# Patient Record
Sex: Male | Born: 1995 | Race: Black or African American | Hispanic: No | Marital: Single | Smoking: Never smoker
Health system: Southern US, Community
[De-identification: ages and names within clinical notes are randomized; demographics above are authoritative.]

## PROBLEM LIST (undated history)

## (undated) ENCOUNTER — Emergency Department (HOSPITAL_COMMUNITY): Admission: EM | Discharge status: Absent without leave | Payer: BC Managed Care – PPO

## (undated) HISTORY — PX: ADENOIDECTOMY: SUR15

---
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
## 2017-01-22 ENCOUNTER — Encounter (HOSPITAL_COMMUNITY): Payer: Self-pay | Admitting: Emergency Medicine

## 2017-01-22 ENCOUNTER — Emergency Department (HOSPITAL_COMMUNITY)
Admission: EM | Admit: 2017-01-22 | Discharge: 2017-01-22 | Disposition: A | Discharge status: Home / Self Care | Payer: BC Managed Care – PPO | Attending: Emergency Medicine | Admitting: Emergency Medicine

## 2017-01-22 DIAGNOSIS — R1084 Generalized abdominal pain: Secondary | ICD-10-CM | POA: Diagnosis not present

## 2017-01-22 DIAGNOSIS — R112 Nausea with vomiting, unspecified: Secondary | ICD-10-CM | POA: Diagnosis present

## 2017-01-22 DIAGNOSIS — K920 Hematemesis: Secondary | ICD-10-CM | POA: Diagnosis not present

## 2017-01-22 LAB — URINALYSIS, ROUTINE W REFLEX MICROSCOPIC
Bacteria, UA: NONE SEEN
Bilirubin Urine: NEGATIVE
Glucose, UA: NEGATIVE mg/dL
Hgb urine dipstick: NEGATIVE
Ketones, ur: NEGATIVE mg/dL
LEUKOCYTES UA: NEGATIVE
Nitrite: NEGATIVE
PH: 7 (ref 5.0–8.0)
Protein, ur: 30 mg/dL — AB
SPECIFIC GRAVITY, URINE: 1.027 (ref 1.005–1.030)

## 2017-01-22 LAB — COMPREHENSIVE METABOLIC PANEL
ALK PHOS: 68 U/L (ref 38–126)
ALT: 20 U/L (ref 17–63)
ANION GAP: 8 (ref 5–15)
AST: 24 U/L (ref 15–41)
Albumin: 4.1 g/dL (ref 3.5–5.0)
BUN: 15 mg/dL (ref 6–20)
CO2: 24 mmol/L (ref 22–32)
Calcium: 9.5 mg/dL (ref 8.9–10.3)
Chloride: 106 mmol/L (ref 101–111)
Creatinine, Ser: 1.37 mg/dL — ABNORMAL HIGH (ref 0.61–1.24)
GFR calc Af Amer: >60 mL/min (ref >60)
Glucose, Bld: 92 mg/dL (ref 65–99)
Potassium: 4.4 mmol/L (ref 3.5–5.1)
Sodium: 138 mmol/L (ref 135–145)
TOTAL PROTEIN: 6.9 g/dL (ref 6.5–8.1)
Total Bilirubin: 1.3 mg/dL — ABNORMAL HIGH (ref 0.3–1.2)

## 2017-01-22 LAB — CBC
HCT: 42.8 % (ref 39.0–52.0)
Hemoglobin: 14.4 g/dL (ref 13.0–17.0)
MCH: 31.7 pg (ref 26.0–34.0)
MCHC: 33.6 g/dL (ref 30.0–36.0)
MCV: 94.3 fL (ref 78.0–100.0)
Platelets: 222 10*3/uL (ref 150–400)
RBC: 4.54 MIL/uL (ref 4.22–5.81)
RDW: 13.3 % (ref 11.5–15.5)
WBC: 3.1 10*3/uL — ABNORMAL LOW (ref 4.0–10.5)

## 2017-01-22 LAB — POC OCCULT BLOOD, ED: Fecal Occult Bld: NEGATIVE

## 2017-01-22 LAB — LIPASE, BLOOD: Lipase: 23 U/L (ref 11–51)

## 2017-01-22 MED ORDER — PANTOPRAZOLE SODIUM 20 MG PO TBEC: 20.0000 mg | DELAYED_RELEASE_TABLET | Freq: Every day | ORAL | 0 refills | Status: DC

## 2017-01-22 MED ORDER — ONDANSETRON 4 MG PO TBDP
ORAL_TABLET | ORAL | Status: AC
  Filled 2017-01-22: qty 1

## 2017-01-22 MED ORDER — ONDANSETRON 8 MG PO TBDP: 8.0000 mg | ORAL_TABLET | Freq: Three times a day (TID) | ORAL | 0 refills | Status: AC | PRN

## 2017-01-22 MED ORDER — ONDANSETRON 4 MG PO TBDP
4.0000 mg | ORAL_TABLET | Freq: Once | ORAL | Status: AC | PRN
  Administered 2017-01-22: 4 mg via ORAL

## 2017-01-22 MED ORDER — FAMOTIDINE 20 MG PO TABS: 20.0000 mg | ORAL_TABLET | Freq: Two times a day (BID) | ORAL | 0 refills | Status: AC

## 2017-01-22 NOTE — ED Triage Notes (Signed)
Onset 2 day ago "throwing up blood" multiple episodes with general abdominal pain 7/10 achy. Denies diarrhea.

## 2017-01-22 NOTE — ED Provider Notes (Signed)
[facility]-EMERGENCY DEPT Provider Note   CSN: [id number] Arrival date & time: 01/22/17  0917     History   Chief Complaint Chief Complaint  Patient presents with  . Hematemesis  . Abdominal Pain    HPI Rick Molina is a 21 y.o. male.  HPI Rick Molina is a 21 y.o. male presents to emergency department complaining of nausea, vomiting, bloody emesis. Patient states that he started throwing up 2 days ago. He states initially threw up during practice. Patient is a [employer] at [employer]. Patient states initially was throwing up water, however started seeing bright red streaks of blood in his emesis. He has thrown up multiple times in the last 2 days each time with some bloody emesis. He is reporting some epigastric abdominal pain as well as lower abdominal pain. The pain comes and goes. Describes it as sharp. Currently no pain. Also describes associated dizziness. He states that they have been practicing a lot outside in the heat. He has been drinking plenty of water. He states he still able to eat and keep food and fluids down. He states that eating does not make his nausea or vomiting worse, also not making his abdominal pain worse. Patient denies taking any blood thinners. He denies having any bleeding disorders. He states he is having normal bowel movements. He denies alcohol use. He does not smoke. He has been using ibuprofen every day for minor aches and pains.   History reviewed. No pertinent past medical history.  There are no active problems to display for this patient.   Past Surgical History:  Procedure Laterality Date  . ADENOIDECTOMY         Home Medications    Prior to Admission medications   Not on File    Family History Family History  Problem Relation Age of Onset  . Cancer Mother   . Hypertension Mother     Social History Social History  Substance Use Topics  . Smoking status: Never Smoker  . Smokeless tobacco: Not on file  . Alcohol use No       Allergies   Shellfish allergy   Review of Systems Review of Systems  Constitutional: Negative for chills and fever.  Respiratory: Negative for cough, chest tightness and shortness of breath.   Cardiovascular: Negative for chest pain, palpitations and leg swelling.  Gastrointestinal: Positive for abdominal pain, nausea and vomiting. Negative for abdominal distention, blood in stool and diarrhea.  Genitourinary: Negative for dysuria, frequency, hematuria and urgency.  Musculoskeletal: Negative for arthralgias, myalgias, neck pain and neck stiffness.  Skin: Negative for rash.  Allergic/Immunologic: Negative for immunocompromised state.  Neurological: Positive for dizziness and light-headedness. Negative for weakness, numbness and headaches.  All other systems reviewed and are negative.    Physical Exam Updated Vital Signs BP 131/80   Pulse 60   Temp 98.3 F (36.8 C) (Oral)   Resp 18   Ht 6' (1.829 m)   Wt 135.2 kg (298 lb)   SpO2 100%   BMI 40.42 kg/m   Physical Exam  Constitutional: He is oriented to person, place, and time. He appears well-developed and well-nourished. No distress.  HENT:  Head: Normocephalic and atraumatic.  Eyes: Conjunctivae are normal.  Neck: Neck supple.  Cardiovascular: Normal rate, regular rhythm and normal heart sounds.   Pulmonary/Chest: Effort normal. No respiratory distress. He has no wheezes. He has no rales.  Abdominal: Soft. Bowel sounds are normal. He exhibits no distension. There is tenderness. There is no  rebound.  Epigastric and suprapubic tenderness  Musculoskeletal: He exhibits no edema.  Neurological: He is alert and oriented to person, place, and time.  Skin: Skin is warm and dry.  Nursing note and vitals reviewed.    ED Treatments / Results  Labs (all labs ordered are listed, but only abnormal results are displayed) Labs Reviewed  COMPREHENSIVE METABOLIC PANEL - Abnormal; Notable for the following:       Result Value    Creatinine, Ser 1.37 (*)    Total Bilirubin 1.3 (*)    All other components within normal limits  CBC - Abnormal; Notable for the following:    WBC 3.1 (*)    All other components within normal limits  URINALYSIS, ROUTINE W REFLEX MICROSCOPIC - Abnormal; Notable for the following:    Protein, ur 30 (*)    Squamous Epithelial / LPF 0-5 (*)    All other components within normal limits  LIPASE, BLOOD  POC OCCULT BLOOD, ED    EKG  EKG Interpretation None       Radiology No results found.  Procedures Procedures (including critical care time)  Medications Ordered in ED Medications  ondansetron (ZOFRAN-ODT) 4 MG disintegrating tablet (not administered)  ondansetron (ZOFRAN-ODT) disintegrating tablet 4 mg (4 mg Oral Given 01/22/17 [id number])     Initial Impression / Assessment and Plan / ED Course  I have reviewed the triage vital signs and the nursing notes.  Pertinent labs & imaging results that were available during my care of the patient were reviewed by me and considered in my medical decision making (see chart for details).     Patient in emergency department with nausea and vomiting, with bloody streaks in his emesis. He is nontoxic-appearing, abdomen is soft, mild tenderness in epigastric area present. Vital signs are normal. Hemoglobin is stable. No difficulty eating. Question gastritis versus ulcer disease versus Mallory-Weiss tear. I do not think he needs any further evaluation in the department at this time. His Hemoccult was negative. We'll start him on Pepcid and Protonix, follow-up with gastroenterology as needed. Return precautions discussed. Advised to stop all NSAIDs.   Vitals:   01/22/17 1200 01/22/17 1215 01/22/17 1230 01/22/17 1245  BP: 130/70 (!) 132/58 121/77 131/80  Pulse: (!) 58  (!) 116 60  Resp:      Temp:      TempSrc:      SpO2:      Weight:      Height:         Final Clinical Impressions(s) / ED Diagnoses   Final diagnoses:  Hematemesis  with nausea    New Prescriptions New Prescriptions   FAMOTIDINE (PEPCID) 20 MG TABLET    Take 1 tablet (20 mg total) by mouth 2 (two) times daily.   ONDANSETRON (ZOFRAN ODT) 8 MG DISINTEGRATING TABLET    Take 1 tablet (8 mg total) by mouth every 8 (eight) hours as needed for nausea or vomiting.   PANTOPRAZOLE (PROTONIX) 20 MG TABLET    Take 1 tablet (20 mg total) by mouth daily.     Jaynie CrumbleKirichenko, Marykate Heuberger, PA-C 01/22/17 1304    Rolland PorterJames, Mark, MD 01/23/17 [phone]

## 2017-01-22 NOTE — ED Notes (Signed)
ED Provider at bedside. 

## 2017-01-22 NOTE — Discharge Instructions (Signed)
Start taking both pepcid and protonix daily for stomach irritation. Zofran for nausea and vomiting as needed. Avoid any NSAID medications such as ibuprofen, aleve, advil. Take tylenol as needed for pain. Follow up with gastroenterologist if continue to have symptoms.

## 2017-01-22 NOTE — ED Notes (Signed)
Pt ambulated to lobby with family and friend. Pt had no complaints or concerns upon discharge.

## 2017-01-22 NOTE — ED Notes (Addendum)
ED Provider at bedside. Occult collection.

## 2017-01-27 ENCOUNTER — Telehealth: Payer: Self-pay | Admitting: Internal Medicine

## 2017-01-27 NOTE — Telephone Encounter (Signed)
I spoke with Dr. Leona SingletonLake and advised him we can see the patient tomorrow and that I will attempt to reach the patient to arrange the appt.  I left a message for the patient that I have scheduled an appt for tomorrow at 2:15 with Hyacinth MeekerJennifer, Lemmon, PA And to please call me and confirm he got the message.

## 2017-01-27 NOTE — Telephone Encounter (Signed)
Sent to attention DOD Dr.Perry please advise as to scheduling.

## 2017-01-28 ENCOUNTER — Encounter: Payer: Self-pay | Admitting: Physician Assistant

## 2017-01-28 ENCOUNTER — Ambulatory Visit (INDEPENDENT_AMBULATORY_CARE_PROVIDER_SITE_OTHER): Payer: BC Managed Care – PPO | Admitting: Physician Assistant

## 2017-01-28 ENCOUNTER — Other Ambulatory Visit: Payer: Self-pay

## 2017-01-28 VITALS — BP 124/80 | HR 84 | Ht 72.0 in | Wt 296.0 lb

## 2017-01-28 DIAGNOSIS — R1013 Epigastric pain: Secondary | ICD-10-CM

## 2017-01-28 DIAGNOSIS — K920 Hematemesis: Secondary | ICD-10-CM | POA: Diagnosis not present

## 2017-01-28 MED ORDER — PANTOPRAZOLE SODIUM 40 MG PO TBEC: 40.0000 mg | DELAYED_RELEASE_TABLET | Freq: Every day | ORAL | 3 refills | Status: AC

## 2017-01-28 NOTE — Patient Instructions (Signed)
You have been scheduled for an endoscopy. Please follow written instructions given to you at your visit today. If you use inhalers (even only as needed), please bring them with you on the day of your procedure. Your physician has requested that you go to www.startemmi.com and enter the access code given to you at your visit today. This web site gives a general overview about your procedure. However, you should still follow specific instructions given to you by our office regarding your preparation for the procedure.  We have sent the following medications to your pharmacy for you to pick up at your convenience: Pantoprazole  If you are age 20 or older, your body mass index should be between 23-30. Your Body mass index is 40.14 kg/m. If this is out of the aforementioned range listed, please consider follow up with your Primary Care Provider.  If you are age 20 or younger, your body mass index should be between 19-25. Your Body mass index is 40.14 kg/m. If this is out of the aformentioned range listed, please consider follow up with your Primary Care Provider.

## 2017-01-28 NOTE — Progress Notes (Signed)
Chief Complaint: Hematemesis, Epigastric pain  HPI:  Rick Molina is a 21 year old African-American male, who was referred to me by Georgann Housekeeper, MD for a complaint of hematemesis and epigastric pain .     Patient was recently seen in the ER on 01/22/17 with hematemesis and epigastric pain. It was noted that he is a [employer] and initially started throwing up water 2 days prior during practice, he started seeing bright red streaks of blood in his emesis. He threw up multiple times over those 2 days and had some epigastric pain. Labs at that time showed a creatinine of 1.37 and were otherwise normal including a Lipase. Patient was started on Pepcid 20 mg twice a day, Zofran 8 mg every 8 hours and Pantoprazole 20 mg once daily.   Today, the patient presents to clinic accompanied by his football trainer and tells me that he has continued with episodes of nausea and vomiting over the past 2 weeks, the last on Saturday. He has continued to see some bright red blood streaking his vomit on occasion. Associated symptoms include an epigastric "discomfort", he rates this as a 1-2/10. When he vomits it is mostly "clear/yellow" substance. He has been using Zofran as directed but has not taken any since Sunday. He has been using Pepcid 20 mg twice a day and Pantoprazole 20 mg once a day. Overall the patient feels much better. He has been out of practice for 1 week. 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 They do try to stay well hydrated during practice.   Patient denies a change in diet, alcohol use, fever, chills, blood in the stool, melena, weight loss, anorexia, heartburn, reflux, dysphagia or symptoms that awaken him at night.  History reviewed. No pertinent past medical history.  Past Surgical History:  Procedure Laterality Date  . ADENOIDECTOMY      Current Outpatient Prescriptions  Medication Sig Dispense Refill  .  famotidine (PEPCID) 20 MG tablet Take 1 tablet (20 mg total) by mouth 2 (two) times daily. 30 tablet 0  . ondansetron (ZOFRAN ODT) 8 MG disintegrating tablet Take 1 tablet (8 mg total) by mouth every 8 (eight) hours as needed for nausea or vomiting. 12 tablet 0  . pantoprazole (PROTONIX) 20 MG tablet Take 1 tablet (20 mg total) by mouth daily. 30 tablet 0   No current facility-administered medications for this visit.     Allergies as of 01/28/2017 - Review Complete 01/28/2017  Allergen Reaction Noted  . Shellfish allergy  01/22/2017    Family History  Problem Relation Age of Onset  . Cancer Mother   . Hypertension Mother     Social History   Social History  . Marital status: Single    Spouse name: N/A  . Number of children: N/A  . Years of education: N/A   Occupational History  . Not on file.   Social History Main Topics  . Smoking status: Never Smoker  . Smokeless tobacco: Never Used  . Alcohol use No  . Drug use: No  . Sexual activity: Not on file   Other Topics Concern  . Not on file   Social History Narrative  . No narrative on file    Review of Systems:    Constitutional: No weight loss, fever or chills Skin: No rash  Cardiovascular: No chest pain, chest pressure or palpitations   Respiratory: No SOB  Gastrointestinal: See  HPI and otherwise negative Genitourinary: No dysuria  Neurological: No headache Musculoskeletal: No new muscle or joint pain Hematologic: No bleeding or bruising Psychiatric: No history of depression or anxiety   Physical Exam:  Vital signs: BP 124/80 (BP Location: Right Arm, Patient Position: Sitting, Cuff Size: Large)   Pulse 84   Ht 6' (1.829 m)   Wt 296 lb (134.3 kg)   BMI 40.14 kg/m   Constitutional:   Pleasant African American male appears to be in NAD, Well developed, Well nourished, alert and cooperative Head:  Normocephalic and atraumatic. Eyes:   PEERL, EOMI. No icterus. Conjunctiva pink. Ears:  Normal auditory  acuity. Neck:  Supple Throat: Oral cavity and pharynx without inflammation, swelling or lesion.  Respiratory: Respirations even and unlabored. Lungs clear to auscultation bilaterally.   No wheezes, crackles, or rhonchi.  Cardiovascular: Normal S1, S2. No MRG. Regular rate and rhythm. No peripheral edema, cyanosis or pallor.  Gastrointestinal:  Soft, nondistended, mild epigastric ttp No rebound or guarding. Normal bowel sounds. No appreciable masses or hepatomegaly. Rectal:  Not performed.  Msk:  Symmetrical without gross deformities. Without edema, no deformity or joint abnormality.  Neurologic:  Alert and  oriented x4;  grossly normal neurologically.  Skin:   Dry and intact without significant lesions or rashes. Psychiatric: Demonstrates good judgement and reason without abnormal affect or behaviors.  RELEVANT LABS AND IMAGING: CBC    Component Value Date/Time   WBC 3.1 (L) 01/22/2017 0947   RBC 4.54 01/22/2017 0947   HGB 14.4 01/22/2017 0947   HCT 42.8 01/22/2017 0947   PLT 222 01/22/2017 0947   MCV 94.3 01/22/2017 0947   MCV 93.4 12/05/2014 1043   MCH 31.7 01/22/2017 0947   MCHC 33.6 01/22/2017 0947   RDW 13.3 01/22/2017 0947    CMP     Component Value Date/Time   NA 138 01/22/2017 0947   K 4.4 01/22/2017 0947   CL 106 01/22/2017 0947   CO2 24 01/22/2017 0947   GLUCOSE 92 01/22/2017 0947   BUN 15 01/22/2017 0947   CREATININE 1.37 (H) 01/22/2017 0947   CREATININE 1.26 12/05/2014 1035   CALCIUM 9.5 01/22/2017 0947   PROT 6.9 01/22/2017 0947   ALBUMIN 4.1 01/22/2017 0947   AST 24 01/22/2017 0947   ALT 20 01/22/2017 0947   ALKPHOS 68 01/22/2017 0947   BILITOT 1.3 (H) 01/22/2017 0947   GFRNONAA >60 01/22/2017 0947   GFRAA >60 01/22/2017 0947    Assessment: 1. Hematemesis: Streaking his emesis over the past 2 weeks, last episode on Saturday, 01/25/17; most likely Mallory-Weiss tear 2. Epigastric pain: Over the past 2 weeks with nausea and hematemesis as above, better  on H2 blocker and PPI as well as Zofran; consider H. pylori versus gastritis versus other  Plan: 1. Symptoms are better with when necessary Zofran, Pepcid 20 twice a day and Pantoprazole 20 mg daily, no further episodes of hematemesis since Sunday. Patient is going to go back to hard training for football and wants to be cleared. With this in mind, recommend patient have EGD for full evaluation before he is cleared medically. 2. Scheduled EGD with Dr. Myrtie Neither in the [facility] tomorrow as he had some openings. Did discuss risks, benefits, limitations and alternatives and the patient agrees to proceed. 3. Patient was also discussed with Dr. Leone Payor supervising this morning. 4. Patient to continue on Pantoprazole 40 mg once daily. He can discontinue his Pepcid once this runs out in 2-3 days. 5. He may continue  Zofran as needed 6. Follow up to be recommended by Dr. Myrtie Neitheranis after time of procedure tomorrow.  Hyacinth MeekerJennifer Lemmon, PA-C [facility] 01/28/2017, 2:35 PM  Cc: Georgann Housekeeperooper, Alan, MD

## 2017-01-28 NOTE — Progress Notes (Signed)
Thank you for sending this case to me and for discussing it in clinic today. I have reviewed the entire note, and the outlined plan seems appropriate.   Ardell Makarewicz Danis, MD  

## 2017-01-28 NOTE — Telephone Encounter (Signed)
Pt states he will be at the visit today.

## 2017-01-29 ENCOUNTER — Ambulatory Visit (AMBULATORY_SURGERY_CENTER): Payer: BC Managed Care – PPO | Admitting: Gastroenterology

## 2017-01-29 ENCOUNTER — Encounter: Payer: Self-pay | Admitting: Gastroenterology

## 2017-01-29 VITALS — BP 114/64 | HR 64 | Temp 98.9°F | Resp 17 | Ht 72.0 in | Wt 296.0 lb

## 2017-01-29 DIAGNOSIS — K920 Hematemesis: Secondary | ICD-10-CM

## 2017-01-29 DIAGNOSIS — K3189 Other diseases of stomach and duodenum: Secondary | ICD-10-CM | POA: Diagnosis not present

## 2017-01-29 DIAGNOSIS — R112 Nausea with vomiting, unspecified: Secondary | ICD-10-CM | POA: Diagnosis not present

## 2017-01-29 DIAGNOSIS — R1013 Epigastric pain: Secondary | ICD-10-CM

## 2017-01-29 MED ORDER — SODIUM CHLORIDE 0.9 % IV SOLN: 500.0000 mL | INTRAVENOUS | Status: AC

## 2017-01-29 NOTE — Op Note (Signed)
[facility] Patient Name: Rick Molina Procedure Date: 01/29/2017 9:45 AM MRN: [id number] Endoscopist: Sherilyn Cooter L. Myrtie Neither , MD Age: 21 Referring MD:  Date of Birth: 1996/05/03 Gender: Male Account #: [id number] Procedure:                Upper GI endoscopy Indications:              Epigastric abdominal pain, Hematemesis, Nausea with                            vomiting Medicines:                Monitored Anesthesia Care Procedure:                Pre-Anesthesia Assessment:                           - Prior to the procedure, a History and Physical                            was performed, and patient medications and                            allergies were reviewed. The patient's tolerance of                            previous anesthesia was also reviewed. The risks                            and benefits of the procedure and the sedation                            options and risks were discussed with the patient.                            All questions were answered, and informed consent                            was obtained. Prior Anticoagulants: The patient has                            taken no previous anticoagulant or antiplatelet                            agents. ASA Grade Assessment: I - A normal, healthy                            patient. After reviewing the risks and benefits,                            the patient was deemed in satisfactory condition to                            undergo the procedure.  After obtaining informed consent, the endoscope was                            passed under direct vision. Throughout the                            procedure, the patient's blood pressure, pulse, and                            oxygen saturations were monitored continuously. The                            Model GIF-[id number](SN#[id number]) scope was introduced                            through the mouth, and advanced to the second part                        of duodenum. The upper GI endoscopy was                            accomplished without difficulty. The patient                            tolerated the procedure well. Scope In: Scope Out: Findings:                 The esophagus was normal.                           Striped mildly erythematous mucosa without bleeding                            was found in the prepyloric region of the stomach.                            Random gastric biopsies were taken ([facility]                            protocol).                           The cardia and gastric fundus were normal on                            retroflexion.                           The examined duodenum was normal. Complications:            No immediate complications. Estimated Blood Loss:     Estimated blood loss was minimal. Impression:               - Normal esophagus.                           - Erythematous mucosa in the prepyloric region of  the stomach. More likely to be the result of rather                            than the cause of recent vomiting.                           - Normal examined duodenum.                           - No specimens collected. Recommendation:           - Patient has a contact number available for                            emergencies. The signs and symptoms of potential                            delayed complications were discussed with the                            patient. Return to normal activities tomorrow.                            Written discharge instructions were provided to the                            patient.                           - Resume previous diet.                           - Discontinue PPIs today.                           - Discontinue H2 blocker today.                           - Await pathology results.                           - Patient can return to athletic training tomorrow.                           If  symptoms recur during training, a reduction in                            training intensity may be required temporarily.                           Ensure adequate hydration before, during and after                            training.                           - Return to my office as  needed. Hevin Jeffcoat L. Myrtie Neither, MD 01/29/2017 10:05:59 AM This report has been signed electronically.

## 2017-01-29 NOTE — Progress Notes (Signed)
Called to room to assist during endoscopic procedure.  Patient ID and intended procedure confirmed with present staff. Received instructions for my participation in the procedure from the performing physician.  

## 2017-01-29 NOTE — Progress Notes (Signed)
A/ox3 pleased with MAC, report to [facility]

## 2017-01-29 NOTE — Patient Instructions (Signed)
Discharge instructions given. Biopsies taken. Resume previous medications. YOU HAD AN ENDOSCOPIC PROCEDURE TODAY AT THE [facility]:   Refer to the procedure report that was given to you for any specific questions about what was found during the examination.  If the procedure report does not answer your questions, please call your gastroenterologist to clarify.  If you requested that your care partner not be given the details of your procedure findings, then the procedure report has been included in a sealed envelope for you to review at your convenience later.  YOU SHOULD EXPECT: Some feelings of bloating in the abdomen. Passage of more gas than usual.  Walking can help get rid of the air that was put into your GI tract during the procedure and reduce the bloating. If you had a lower endoscopy (such as a colonoscopy or flexible sigmoidoscopy) you may notice spotting of blood in your stool or on the toilet paper. If you underwent a bowel prep for your procedure, you may not have a normal bowel movement for a few days.  Please Note:  You might notice some irritation and congestion in your nose or some drainage.  This is from the oxygen used during your procedure.  There is no need for concern and it should clear up in a day or so.  SYMPTOMS TO REPORT IMMEDIATELY:   Following lower endoscopy (colonoscopy or flexible sigmoidoscopy):  Excessive amounts of blood in the stool  Significant tenderness or worsening of abdominal pains  Swelling of the abdomen that is new, acute  Fever of 100F or higher   For urgent or emergent issues, a gastroenterologist can be reached at any hour by calling [phone].   DIET:  We do recommend a small meal at first, but then you may proceed to your regular diet.  Drink plenty of fluids but you should avoid alcoholic beverages for 24 hours.  ACTIVITY:  You should plan to take it easy for the rest of today and you should NOT DRIVE or use heavy  machinery until tomorrow (because of the sedation medicines used during the test).    FOLLOW UP: Our staff will call the number listed on your records the next business day following your procedure to check on you and address any questions or concerns that you may have regarding the information given to you following your procedure. If we do not reach you, we will leave a message.  However, if you are feeling well and you are not experiencing any problems, there is no need to return our call.  We will assume that you have returned to your regular daily activities without incident.  If any biopsies were taken you will be contacted by phone or by letter within the next 1-3 weeks.  Please call us at [phone] if you have not heard about the biopsies in 3 weeks.    SIGNATURES/CONFIDENTIALITY: You and/or your care partner have signed paperwork which will be entered into your electronic medical record.  These signatures attest to the fact that that the information above on your After Visit Summary has been reviewed and is understood.  Full responsibility of the confidentiality of this discharge information lies with you and/or your care-partner. 

## 2017-01-30 ENCOUNTER — Telehealth: Payer: Self-pay

## 2017-01-30 NOTE — Telephone Encounter (Signed)
  Follow up Call-  Call back number 01/29/2017  Post procedure Call Back phone  # [phone]  Permission to leave phone message Yes  Some recent data might be hidden     Patient questions:  Do you have a fever, pain , or abdominal swelling? No. Pain Score  0 *  Have you tolerated food without any problems? Yes.    Have you been able to return to your normal activities? Yes.    Do you have any questions about your discharge instructions: Diet   No. Medications  No. Follow up visit  No.  Do you have questions or concerns about your Care? No.  Actions: * If pain score is 4 or above: No action needed, pain <4.

## 2019-01-30 ENCOUNTER — Other Ambulatory Visit: Payer: Self-pay

## 2019-01-30 DIAGNOSIS — Z20822 Contact with and (suspected) exposure to covid-19: Secondary | ICD-10-CM

## 2019-01-31 LAB — NOVEL CORONAVIRUS, NAA: SARS-CoV-2, NAA: NOT DETECTED

## 2019-10-02 ENCOUNTER — Other Ambulatory Visit: Payer: Self-pay

## 2019-10-02 ENCOUNTER — Emergency Department (HOSPITAL_COMMUNITY): Payer: BC Managed Care – PPO

## 2019-10-02 ENCOUNTER — Encounter (HOSPITAL_COMMUNITY): Payer: Self-pay | Admitting: General Surgery

## 2019-10-02 ENCOUNTER — Inpatient Hospital Stay (HOSPITAL_COMMUNITY)
Admission: EM | Admit: 2019-10-02 | Discharge: 2019-10-04 | DRG: 089 | Disposition: A | Discharge status: Home / Self Care | Payer: BC Managed Care – PPO | Attending: Physician Assistant | Admitting: Physician Assistant

## 2019-10-02 DIAGNOSIS — S01512A Laceration without foreign body of oral cavity, initial encounter: Secondary | ICD-10-CM | POA: Diagnosis present

## 2019-10-02 DIAGNOSIS — Y92410 Unspecified street and highway as the place of occurrence of the external cause: Secondary | ICD-10-CM

## 2019-10-02 DIAGNOSIS — S27329A Contusion of lung, unspecified, initial encounter: Secondary | ICD-10-CM

## 2019-10-02 DIAGNOSIS — E876 Hypokalemia: Secondary | ICD-10-CM | POA: Diagnosis present

## 2019-10-02 DIAGNOSIS — Z6841 Body Mass Index (BMI) 40.0 and over, adult: Secondary | ICD-10-CM

## 2019-10-02 DIAGNOSIS — Z87820 Personal history of traumatic brain injury: Secondary | ICD-10-CM | POA: Diagnosis not present

## 2019-10-02 DIAGNOSIS — S060X9A Concussion with loss of consciousness of unspecified duration, initial encounter: Principal | ICD-10-CM | POA: Diagnosis present

## 2019-10-02 DIAGNOSIS — Z20822 Contact with and (suspected) exposure to covid-19: Secondary | ICD-10-CM | POA: Diagnosis present

## 2019-10-02 DIAGNOSIS — S81812A Laceration without foreign body, left lower leg, initial encounter: Secondary | ICD-10-CM | POA: Diagnosis present

## 2019-10-02 DIAGNOSIS — S80212A Abrasion, left knee, initial encounter: Secondary | ICD-10-CM | POA: Diagnosis present

## 2019-10-02 DIAGNOSIS — S41132A Puncture wound without foreign body of left upper arm, initial encounter: Secondary | ICD-10-CM | POA: Diagnosis present

## 2019-10-02 DIAGNOSIS — R7401 Elevation of levels of liver transaminase levels: Secondary | ICD-10-CM | POA: Diagnosis present

## 2019-10-02 DIAGNOSIS — S060X0A Concussion without loss of consciousness, initial encounter: Secondary | ICD-10-CM

## 2019-10-02 DIAGNOSIS — S0121XA Laceration without foreign body of nose, initial encounter: Secondary | ICD-10-CM | POA: Diagnosis present

## 2019-10-02 DIAGNOSIS — N179 Acute kidney failure, unspecified: Secondary | ICD-10-CM | POA: Diagnosis present

## 2019-10-02 DIAGNOSIS — E668 Other obesity: Secondary | ICD-10-CM | POA: Diagnosis present

## 2019-10-02 DIAGNOSIS — M542 Cervicalgia: Secondary | ICD-10-CM

## 2019-10-02 DIAGNOSIS — R52 Pain, unspecified: Secondary | ICD-10-CM | POA: Diagnosis present

## 2019-10-02 DIAGNOSIS — S0990XA Unspecified injury of head, initial encounter: Secondary | ICD-10-CM

## 2019-10-02 LAB — URINALYSIS, ROUTINE W REFLEX MICROSCOPIC
Bilirubin Urine: NEGATIVE
Glucose, UA: NEGATIVE mg/dL
Ketones, ur: NEGATIVE mg/dL
Leukocytes,Ua: NEGATIVE
Nitrite: NEGATIVE
Protein, ur: 100 mg/dL — AB
RBC / HPF: >50 RBC/hpf — ABNORMAL HIGH (ref 0–5)
Specific Gravity, Urine: >1.046 — ABNORMAL HIGH (ref 1.005–1.030)
pH: 5 (ref 5.0–8.0)

## 2019-10-02 LAB — CBC
HCT: 41.5 % (ref 39.0–52.0)
Hemoglobin: 14 g/dL (ref 13.0–17.0)
MCH: 32 pg (ref 26.0–34.0)
MCHC: 33.7 g/dL (ref 30.0–36.0)
MCV: 95 fL (ref 80.0–100.0)
Platelets: 297 10*3/uL (ref 150–400)
RBC: 4.37 MIL/uL (ref 4.22–5.81)
RDW: 12.5 % (ref 11.5–15.5)
WBC: 9.2 10*3/uL (ref 4.0–10.5)
nRBC: 0 % (ref 0.0–0.2)

## 2019-10-02 LAB — LACTIC ACID, PLASMA: Lactic Acid, Venous: 2.1 mmol/L (ref 0.5–1.9)

## 2019-10-02 LAB — COMPREHENSIVE METABOLIC PANEL
ALT: 79 U/L — ABNORMAL HIGH (ref 0–44)
AST: 111 U/L — ABNORMAL HIGH (ref 15–41)
Albumin: 3.8 g/dL (ref 3.5–5.0)
Alkaline Phosphatase: 46 U/L (ref 38–126)
Anion gap: 12 (ref 5–15)
BUN: 15 mg/dL (ref 6–20)
CO2: 22 mmol/L (ref 22–32)
Calcium: 8.8 mg/dL — ABNORMAL LOW (ref 8.9–10.3)
Chloride: 104 mmol/L (ref 98–111)
Creatinine, Ser: 1.72 mg/dL — ABNORMAL HIGH (ref 0.61–1.24)
GFR calc Af Amer: >60 mL/min (ref >60)
GFR calc non Af Amer: 55 mL/min — ABNORMAL LOW (ref >60)
Glucose, Bld: 128 mg/dL — ABNORMAL HIGH (ref 70–99)
Potassium: 3.2 mmol/L — ABNORMAL LOW (ref 3.5–5.1)
Sodium: 138 mmol/L (ref 135–145)
Total Bilirubin: 0.6 mg/dL (ref 0.3–1.2)
Total Protein: 6.3 g/dL — ABNORMAL LOW (ref 6.5–8.1)

## 2019-10-02 LAB — RESPIRATORY PANEL BY RT PCR (FLU A&B, COVID)
Influenza A by PCR: NEGATIVE
Influenza B by PCR: NEGATIVE
SARS Coronavirus 2 by RT PCR: NEGATIVE

## 2019-10-02 LAB — SAMPLE TO BLOOD BANK

## 2019-10-02 LAB — I-STAT CHEM 8, ED
BUN: 16 mg/dL (ref 6–20)
Calcium, Ion: 1.04 mmol/L — ABNORMAL LOW (ref 1.15–1.40)
Chloride: 103 mmol/L (ref 98–111)
Creatinine, Ser: 1.6 mg/dL — ABNORMAL HIGH (ref 0.61–1.24)
Glucose, Bld: 120 mg/dL — ABNORMAL HIGH (ref 70–99)
HCT: 42 % (ref 39.0–52.0)
Hemoglobin: 14.3 g/dL (ref 13.0–17.0)
Potassium: 3.2 mmol/L — ABNORMAL LOW (ref 3.5–5.1)
Sodium: 140 mmol/L (ref 135–145)
TCO2: 26 mmol/L (ref 22–32)

## 2019-10-02 LAB — ETHANOL: Alcohol, Ethyl (B): 10 mg/dL — ABNORMAL HIGH (ref <10)

## 2019-10-02 LAB — PROTIME-INR
INR: 1.1 (ref 0.8–1.2)
Prothrombin Time: 14.2 seconds (ref 11.4–15.2)

## 2019-10-02 MED ORDER — TETANUS-DIPHTH-ACELL PERTUSSIS 5-2.5-18.5 LF-MCG/0.5 IM SUSP
0.5000 mL | Freq: Once | INTRAMUSCULAR | Status: AC
  Administered 2019-10-02: 0.5 mL via INTRAMUSCULAR
  Filled 2019-10-02: qty 0.5

## 2019-10-02 MED ORDER — KCL IN DEXTROSE-NACL 20-5-0.45 MEQ/L-%-% IV SOLN
INTRAVENOUS | Status: DC
  Filled 2019-10-02 (×4): qty 1000

## 2019-10-02 MED ORDER — ENOXAPARIN SODIUM 40 MG/0.4ML ~~LOC~~ SOLN
40.0000 mg | SUBCUTANEOUS | Status: DC
  Administered 2019-10-02: 40 mg via SUBCUTANEOUS
  Filled 2019-10-02: qty 0.4

## 2019-10-02 MED ORDER — ACETAMINOPHEN 325 MG PO TABS
650.0000 mg | ORAL_TABLET | ORAL | Status: DC | PRN
  Filled 2019-10-02: qty 2

## 2019-10-02 MED ORDER — MORPHINE SULFATE (PF) 4 MG/ML IV SOLN
4.0000 mg | INTRAVENOUS | Status: DC | PRN
  Administered 2019-10-02: 4 mg via INTRAVENOUS
  Filled 2019-10-02: qty 1

## 2019-10-02 MED ORDER — MORPHINE SULFATE (PF) 2 MG/ML IV SOLN: 2.0000 mg | INTRAVENOUS | Status: DC | PRN

## 2019-10-02 MED ORDER — LIDOCAINE HCL (PF) 1 % IJ SOLN
INTRAMUSCULAR | Status: AC
  Filled 2019-10-02: qty 5

## 2019-10-02 MED ORDER — OXYCODONE HCL 5 MG PO TABS
10.0000 mg | ORAL_TABLET | ORAL | Status: DC | PRN
  Administered 2019-10-03 (×2): 10 mg via ORAL
  Filled 2019-10-02 (×2): qty 2

## 2019-10-02 MED ORDER — ONDANSETRON 4 MG PO TBDP
4.0000 mg | ORAL_TABLET | Freq: Four times a day (QID) | ORAL | Status: DC | PRN
  Administered 2019-10-03: 4 mg via ORAL
  Filled 2019-10-02: qty 1

## 2019-10-02 MED ORDER — OXYCODONE HCL 5 MG PO TABS: 5.0000 mg | ORAL_TABLET | ORAL | Status: DC | PRN

## 2019-10-02 MED ORDER — ONDANSETRON HCL 4 MG/2ML IJ SOLN: 4.0000 mg | Freq: Four times a day (QID) | INTRAMUSCULAR | Status: DC | PRN

## 2019-10-02 MED ORDER — IOHEXOL 300 MG/ML  SOLN
100.0000 mL | Freq: Once | INTRAMUSCULAR | Status: AC | PRN
  Administered 2019-10-02: 100 mL via INTRAVENOUS

## 2019-10-02 MED ORDER — MORPHINE SULFATE (PF) 2 MG/ML IV SOLN: 1.0000 mg | INTRAVENOUS | Status: DC | PRN

## 2019-10-02 MED ORDER — CHLORHEXIDINE GLUCONATE 0.12 % MT SOLN
15.0000 mL | Freq: Two times a day (BID) | OROMUCOSAL | Status: DC
  Administered 2019-10-02 – 2019-10-04 (×5): 15 mL via OROMUCOSAL
  Filled 2019-10-02 (×6): qty 15

## 2019-10-02 MED ORDER — POTASSIUM CHLORIDE 10 MEQ/100ML IV SOLN
10.0000 meq | INTRAVENOUS | Status: AC
  Administered 2019-10-02 (×4): 10 meq via INTRAVENOUS
  Filled 2019-10-02 (×5): qty 100

## 2019-10-02 MED ORDER — BACITRACIN ZINC 500 UNIT/GM EX OINT
TOPICAL_OINTMENT | Freq: Two times a day (BID) | CUTANEOUS | Status: DC
  Filled 2019-10-02: qty 0.9
  Filled 2019-10-02: qty 28.4

## 2019-10-02 MED ORDER — METOPROLOL TARTRATE 5 MG/5ML IV SOLN: 5.0000 mg | Freq: Four times a day (QID) | INTRAVENOUS | Status: DC | PRN

## 2019-10-02 NOTE — ED Notes (Signed)
Attempted to call report
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
## 2019-10-02 NOTE — ED Notes (Signed)
Family at bedside. 
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
## 2019-10-02 NOTE — Progress Notes (Signed)
Received patient from ED in hospital bed.  Patient is arousable by voice and is oriented x 4.  Denies pain at this time.  Placed on cardiac monitor and verified by 2 people with CCMD.  CHG bath completed.  IV infusing in the left [facility], site unremarkable.  Oriented patient and family to the room.
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
## 2019-10-03 ENCOUNTER — Inpatient Hospital Stay (HOSPITAL_COMMUNITY): Payer: BC Managed Care – PPO

## 2019-10-03 LAB — COMPREHENSIVE METABOLIC PANEL
ALT: 73 U/L — ABNORMAL HIGH (ref 0–44)
AST: 118 U/L — ABNORMAL HIGH (ref 15–41)
Albumin: 3.2 g/dL — ABNORMAL LOW (ref 3.5–5.0)
Alkaline Phosphatase: 38 U/L (ref 38–126)
Anion gap: 6 (ref 5–15)
BUN: 10 mg/dL (ref 6–20)
CO2: 22 mmol/L (ref 22–32)
Calcium: 8.6 mg/dL — ABNORMAL LOW (ref 8.9–10.3)
Chloride: 109 mmol/L (ref 98–111)
Creatinine, Ser: 1.3 mg/dL — ABNORMAL HIGH (ref 0.61–1.24)
GFR calc Af Amer: >60 mL/min (ref >60)
GFR calc non Af Amer: >60 mL/min (ref >60)
Glucose, Bld: 115 mg/dL — ABNORMAL HIGH (ref 70–99)
Potassium: 3.8 mmol/L (ref 3.5–5.1)
Sodium: 137 mmol/L (ref 135–145)
Total Bilirubin: 1 mg/dL (ref 0.3–1.2)
Total Protein: 5.6 g/dL — ABNORMAL LOW (ref 6.5–8.1)

## 2019-10-03 LAB — CBC
HCT: 37.6 % — ABNORMAL LOW (ref 39.0–52.0)
Hemoglobin: 12.8 g/dL — ABNORMAL LOW (ref 13.0–17.0)
MCH: 32.1 pg (ref 26.0–34.0)
MCHC: 34 g/dL (ref 30.0–36.0)
MCV: 94.2 fL (ref 80.0–100.0)
Platelets: 216 10*3/uL (ref 150–400)
RBC: 3.99 MIL/uL — ABNORMAL LOW (ref 4.22–5.81)
RDW: 12.6 % (ref 11.5–15.5)
WBC: 6.7 10*3/uL (ref 4.0–10.5)
nRBC: 0 % (ref 0.0–0.2)

## 2019-10-03 LAB — HIV ANTIBODY (ROUTINE TESTING W REFLEX): HIV Screen 4th Generation wRfx: NONREACTIVE

## 2019-10-03 MED ORDER — ENOXAPARIN SODIUM 80 MG/0.8ML ~~LOC~~ SOLN
80.0000 mg | SUBCUTANEOUS | Status: DC
  Administered 2019-10-03: 80 mg via SUBCUTANEOUS
  Filled 2019-10-03: qty 0.8

## 2019-10-03 MED ORDER — MORPHINE SULFATE (PF) 2 MG/ML IV SOLN: 2.0000 mg | INTRAVENOUS | Status: DC | PRN

## 2019-10-03 MED ORDER — METHOCARBAMOL 500 MG PO TABS
750.0000 mg | ORAL_TABLET | Freq: Three times a day (TID) | ORAL | Status: DC | PRN
  Administered 2019-10-03 – 2019-10-04 (×2): 750 mg via ORAL
  Filled 2019-10-03 (×2): qty 2

## 2019-10-03 MED ORDER — ACETAMINOPHEN 500 MG PO TABS
1000.0000 mg | ORAL_TABLET | Freq: Four times a day (QID) | ORAL | Status: DC
  Administered 2019-10-03 – 2019-10-04 (×3): 1000 mg via ORAL
  Filled 2019-10-03 (×2): qty 2

## 2019-10-03 MED ORDER — IBUPROFEN 600 MG PO TABS
600.0000 mg | ORAL_TABLET | Freq: Three times a day (TID) | ORAL | Status: DC
  Administered 2019-10-03 – 2019-10-04 (×4): 600 mg via ORAL
  Filled 2019-10-03 (×4): qty 1

## 2019-10-03 MED ORDER — ACETAMINOPHEN 500 MG PO TABS
1000.0000 mg | ORAL_TABLET | Freq: Four times a day (QID) | ORAL | Status: DC
  Administered 2019-10-03: 1000 mg via ORAL
  Filled 2019-10-03 (×2): qty 2
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
## 2019-10-03 NOTE — Plan of Care (Signed)
POC initiated and progressing. 

## 2019-10-03 NOTE — Plan of Care (Signed)
  Problem: Clinical Measurements: Goal: Respiratory complications will improve Outcome: Progressing Goal: Cardiovascular complication will be avoided Outcome: Progressing   

## 2019-10-03 NOTE — Plan of Care (Signed)
  Problem: Education: Goal: Knowledge of General Education information will improve Description: Including pain rating scale, medication(s)/side effects and non-pharmacologic comfort measures 10/03/2019 1812 by Verne Grain, RN Outcome: Progressing 10/03/2019 1812 by Verne Grain, RN Outcome: Progressing   Problem: Health Behavior/Discharge Planning: Goal: Ability to manage health-related needs will improve 10/03/2019 1812 by Verne Grain, RN Outcome: Progressing 10/03/2019 1812 by Verne Grain, RN Outcome: Progressing   Problem: Clinical Measurements: Goal: Ability to maintain clinical measurements within normal limits will improve 10/03/2019 1812 by Verne Grain, RN Outcome: Progressing 10/03/2019 1812 by Verne Grain, RN Outcome: Progressing Goal: Will remain free from infection 10/03/2019 1812 by Verne Grain, RN Outcome: Progressing 10/03/2019 1812 by Verne Grain, RN Outcome: Progressing Goal: Diagnostic test results will improve 10/03/2019 1812 by Verne Grain, RN Outcome: Progressing 10/03/2019 1812 by Verne Grain, RN Outcome: Progressing Goal: Respiratory complications will improve 10/03/2019 1812 by Verne Grain, RN Outcome: Progressing 10/03/2019 1812 by Verne Grain, RN Outcome: Progressing Goal: Cardiovascular complication will be avoided 10/03/2019 1812 by Verne Grain, RN Outcome: Progressing 10/03/2019 1812 by Verne Grain, RN Outcome: Progressing

## 2019-10-04 ENCOUNTER — Encounter: Payer: Self-pay | Admitting: Gastroenterology

## 2019-10-04 LAB — CBC
HCT: 38.6 % — ABNORMAL LOW (ref 39.0–52.0)
Hemoglobin: 12.9 g/dL — ABNORMAL LOW (ref 13.0–17.0)
MCH: 32.2 pg (ref 26.0–34.0)
MCHC: 33.4 g/dL (ref 30.0–36.0)
MCV: 96.3 fL (ref 80.0–100.0)
Platelets: 215 10*3/uL (ref 150–400)
RBC: 4.01 MIL/uL — ABNORMAL LOW (ref 4.22–5.81)
RDW: 12.6 % (ref 11.5–15.5)
WBC: 5.5 10*3/uL (ref 4.0–10.5)
nRBC: 0 % (ref 0.0–0.2)

## 2019-10-04 LAB — BASIC METABOLIC PANEL
Anion gap: 6 (ref 5–15)
BUN: 12 mg/dL (ref 6–20)
CO2: 25 mmol/L (ref 22–32)
Calcium: 8.9 mg/dL (ref 8.9–10.3)
Chloride: 107 mmol/L (ref 98–111)
Creatinine, Ser: 1.36 mg/dL — ABNORMAL HIGH (ref 0.61–1.24)
GFR calc Af Amer: >60 mL/min (ref >60)
GFR calc non Af Amer: >60 mL/min (ref >60)
Glucose, Bld: 93 mg/dL (ref 70–99)
Potassium: 4.1 mmol/L (ref 3.5–5.1)
Sodium: 138 mmol/L (ref 135–145)

## 2019-10-04 MED ORDER — SODIUM CHLORIDE 0.9 % IV BOLUS
500.0000 mL | Freq: Once | INTRAVENOUS | Status: AC
  Administered 2019-10-04: 500 mL via INTRAVENOUS

## 2019-10-04 MED ORDER — OXYCODONE HCL 5 MG PO TABS: 5.0000 mg | ORAL_TABLET | Freq: Four times a day (QID) | ORAL | 0 refills | Status: AC | PRN

## 2019-10-04 MED ORDER — METHOCARBAMOL 750 MG PO TABS: 750.0000 mg | ORAL_TABLET | Freq: Three times a day (TID) | ORAL | 0 refills | Status: AC | PRN

## 2019-10-04 MED ORDER — ACETAMINOPHEN 500 MG PO TABS: 1000.0000 mg | ORAL_TABLET | Freq: Four times a day (QID) | ORAL | 0 refills | Status: AC

## 2019-10-04 MED FILL — oxyCODONE HCL 5 MG TABS: 5 | 4 days supply | Qty: 30 | Fill #0

## 2019-10-04 MED FILL — METHOCARBAMOL 750 MG TABS: 750 | 10 days supply | Qty: 30 | Fill #0
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
## 2019-10-04 NOTE — Discharge Instructions (Signed)
Abrasion  An abrasion is a cut or a scrape on the surface of your skin. An abrasion does not go through all the layers of your skin. It is important to take good care of your abrasion to prevent infection. Follow these instructions at home: Medicines  Take or apply over-the-counter and prescription medicines only as told by your doctor.  If you were prescribed an antibiotic medicine, apply it as told by your doctor. Do not stop using the antibiotic even if you start to feel better. Wound care  Clean the wound 2-3 times a day or as often as told by your doctor. To do this: 1. Wash the wound with mild soap and water. 2. Rinse off the soap. 3. Pat a clean towel on the wound to dry it. Do not rub it.  Keep the bandage (dressing) clean and dry as told by your doctor.  Follow instructions from your doctor about how to take care of your wound. Make sure you: ? Wash your hands with soap and water before you change your bandage. If you cannot use soap and water, use hand sanitizer. ? Change your bandage as told by your doctor.  Check your wound every day for signs of infection. Check for: ? Redness, swelling, or pain. ? Fluid or blood. ? Warmth. ? Pus or a bad smell.  If directed, put ice on the injured area. To do this: ? Put ice in a plastic bag. ? Place a towel between your skin and the bag. ? Leave the ice on for 20 minutes, 2-3 times a day. General instructions  Do not take baths, swim, or use a hot tub until your doctor says it is okay.  If there is swelling, raise (elevate) the injured area above the level of your heart while you are sitting or lying down.  Keep all follow-up visits as told by your doctor. This is important. Contact a doctor if:  You were given a tetanus shot, and you have any of these where the needle went in: ? Swelling. ? Very bad pain. ? Redness. ? Bleeding.  You have a lot of pain, and medicine does not help.  You have any of these at the site of  the wound: ? More redness. ? More swelling. ? More pain. Get help right away if:  You have a red streak going away from your wound.  You have a fever.  You have fluid, blood, or pus coming from your wound.  There is a bad smell coming from your wound or bandage. Summary  An abrasion is a cut or a scrape on the surface of your skin.  Take good care of your abrasion so it does not get infected.  Clean the wound with mild soap and water, and change your bandage as told by your doctor.  Call your doctor if you have redness, swelling, or more pain in your wound.  Get help right away if you have a fever or if you have fluid, blood, pus, a bad smell, or a red streak coming from the wound. This information is not intended to replace advice given to you by your health care provider. Make sure you discuss any questions you have with your health care provider. Document Revised: 05/16/2017 Document Reviewed: 01/23/2017 Elsevier Patient Education  [place].   Post-Concussion Syndrome  Post-concussion syndrome is when symptoms last longer than normal after a head injury. What are the signs or symptoms? After a head injury, you may:  Have headaches.  Feel tired.  Feel dizzy.  Feel weak.  Have trouble seeing.  Have trouble in bright lights.  Have trouble hearing.  Not be able to remember things.  Not be able to focus.  Have trouble sleeping.  Have mood swings.  Have trouble learning new things. These can last from weeks to months. Follow these instructions at home: Medicines  Take all medicines only as told by your doctor.  Do not take prescription pain medicines. Activity  Limit activities as told by your doctor. This includes: ? Homework. ? Job-related work. ? Thinking. ? Watching TV. ? Using a computer or phone. ? Puzzles. ? Exercise. ? Sports.  Slowly return to your normal activity as told by your doctor.  Stop an activity if you have  symptoms.  Do not do anything that may cause you to get injured again. General instructions  Rest. Try to: ? Sleep 7-9 hours each night. ? Take naps or breaks when you feel tired during the day.  Do not drink alcohol until your doctor says that you can.  Keep track of your symptoms.  Keep all follow-up visits as told by your doctor. This is important. Contact a doctor if:  You do not improve.  You get worse.  You have another injury. Get help right away if:  You have a very bad headache.  You feel confused.  You feel very sleepy.  You pass out (faint).  You throw up (vomit).  You feel weak in any part of your body.  You feel numb in any part of your body.  You start shaking (have a seizure).  You have trouble talking. Summary  Post-concussion syndrome is when symptoms last longer than normal after a head injury.  Limit all activity after your injury. Gradually return to normal activity as told by your doctor.  Rest, do not drink alcohol, and avoid prescription pain medicines after a concussion.  Call your doctor if your symptoms get worse. This information is not intended to replace advice given to you by your health care provider. Make sure you discuss any questions you have with your health care provider. Document Revised: 03/26/2018 Document Reviewed: 07/08/2017 Elsevier Patient Education  2020 [employer].

## 2019-10-04 NOTE — Plan of Care (Signed)
Continue to monitor
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
## 2021-05-12 IMAGING — DX DG CERVICAL SPINE FLEX&EXT ONLY
2 series · 2 of 2 positions shown · non-contrast
Comparison: Cervical spine CT yesterday.

CLINICAL DATA: Neck pain. Negative CT but midline tenderness with
palpation.

EXAM:
CERVICAL SPINE - FLEXION AND EXTENSION VIEWS ONLY

[c-spine flex]
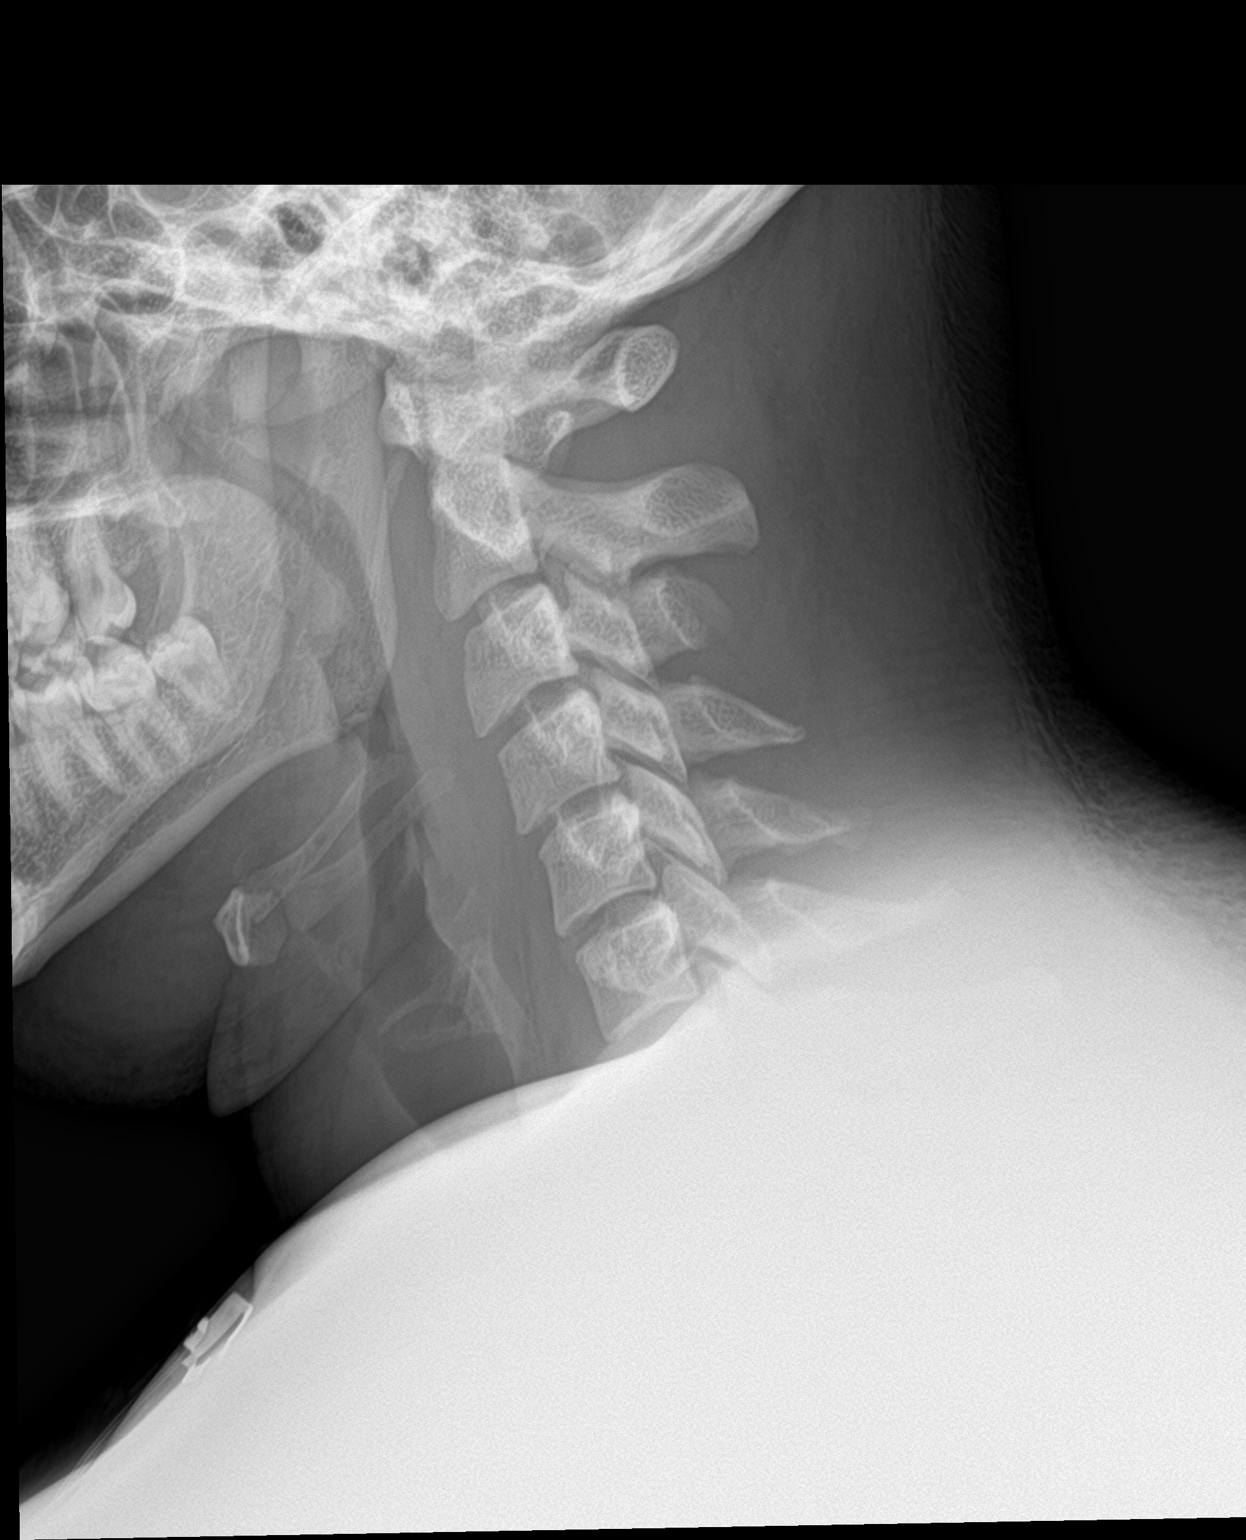

[c-spine ext]
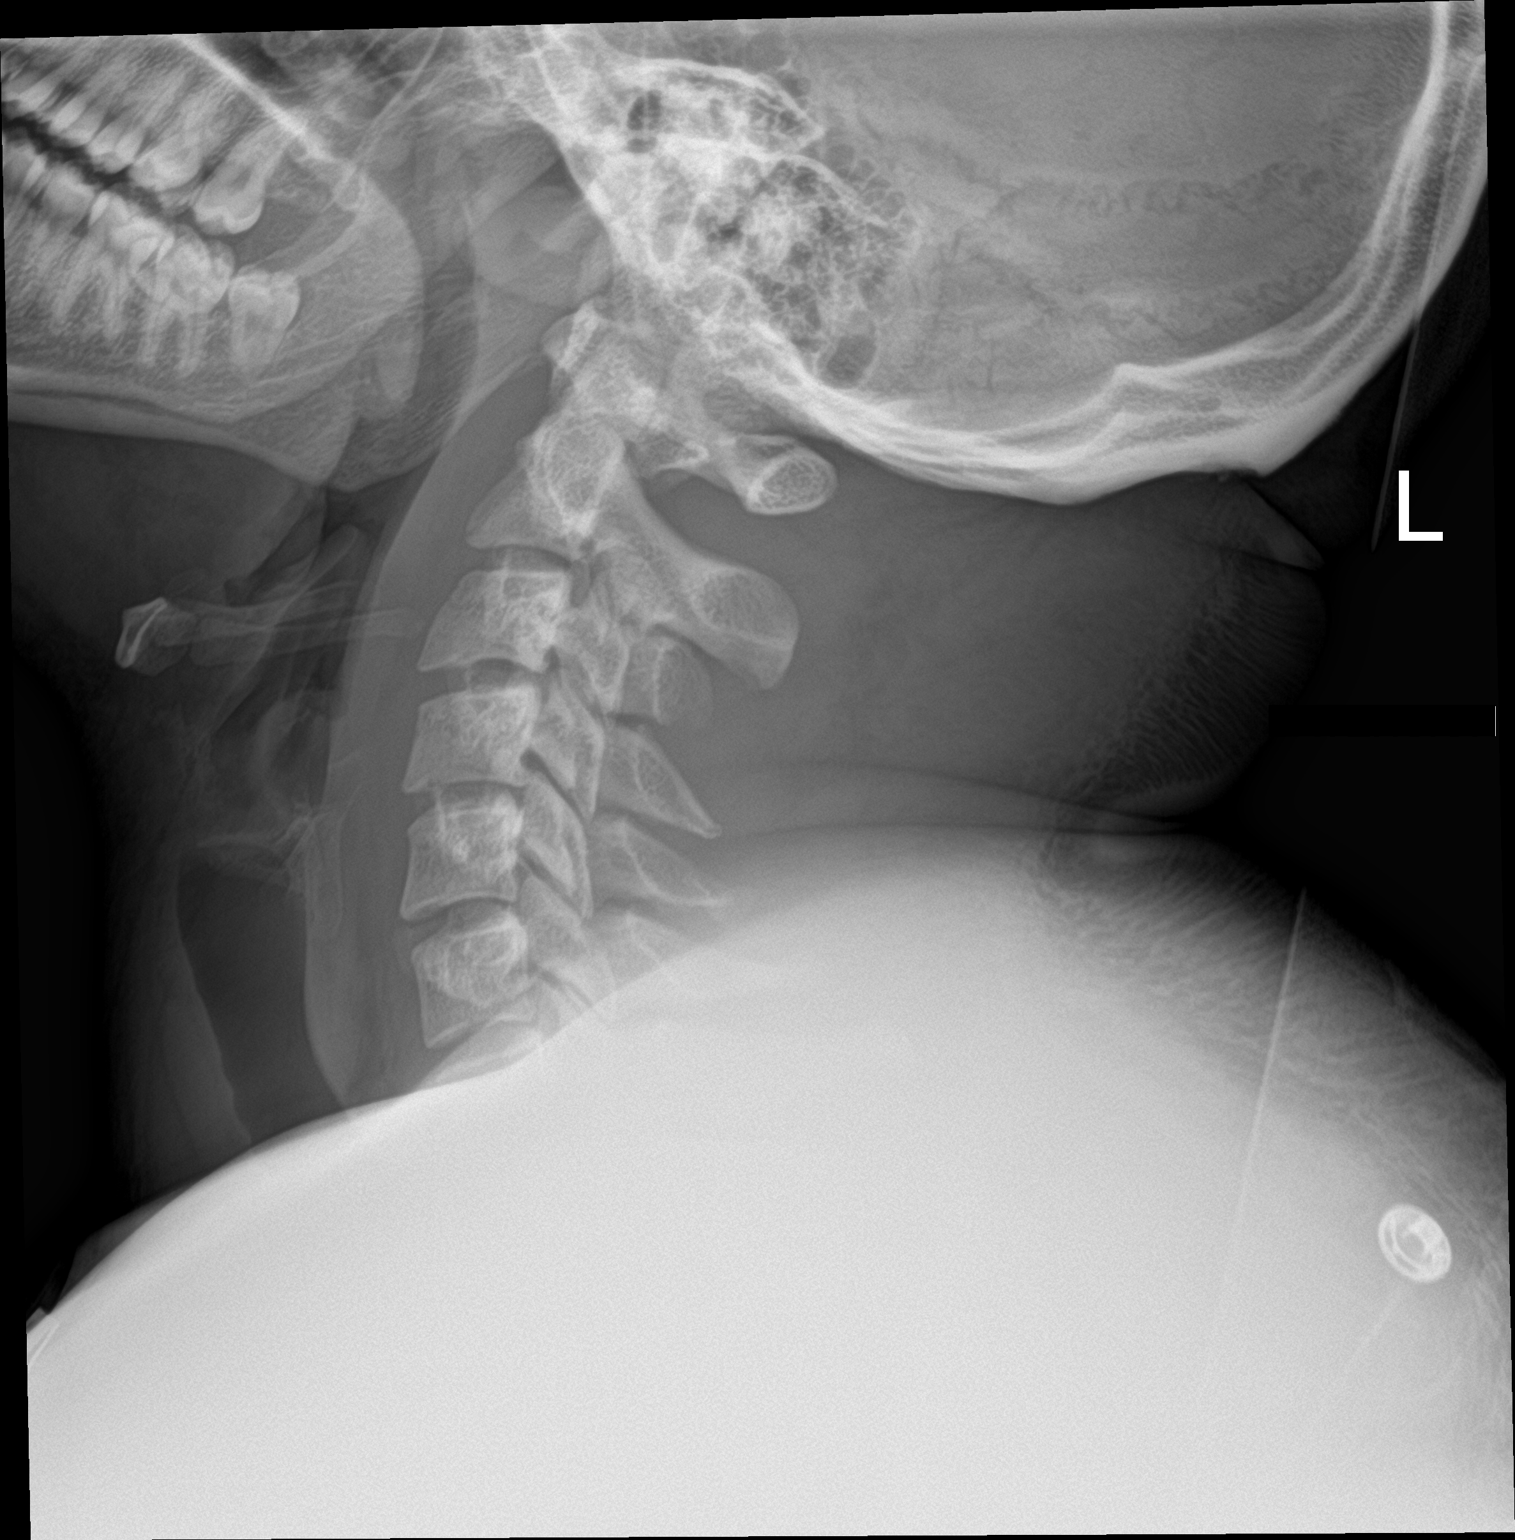

[2 of 2 positions shown; findings below may reference images not displayed]

FINDINGS: Skull base through C6 are visualized, C6-C7, C7 vertebra, and
cervicothoracic junction are obscured. There is no abnormal motion
on flexion or extension imaging. No radiographic evidence of
fracture.
IMPRESSION: No abnormal motion with flexion or extension. C7 and the
cervicothoracic junction are obscured due to osseous and soft tissue
overlap.
